# Patient Record
Sex: Female | Born: 2005 | Race: Black or African American | Hispanic: No | Marital: Single | Smoking: Never smoker
Health system: Southern US, Community
[De-identification: ages and names within clinical notes are randomized; demographics above are authoritative.]

## PROBLEM LIST (undated history)

## (undated) DIAGNOSIS — F419 Anxiety disorder, unspecified: Secondary | ICD-10-CM

## (undated) DIAGNOSIS — F32A Depression, unspecified: Secondary | ICD-10-CM

## (undated) DIAGNOSIS — F909 Attention-deficit hyperactivity disorder, unspecified type: Secondary | ICD-10-CM

## (undated) DIAGNOSIS — F329 Major depressive disorder, single episode, unspecified: Secondary | ICD-10-CM

## (undated) DIAGNOSIS — F4310 Post-traumatic stress disorder, unspecified: Secondary | ICD-10-CM

## (undated) HISTORY — DX: Post-traumatic stress disorder, unspecified: F43.10

---
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
## 2023-03-06 ENCOUNTER — Telehealth: Payer: Self-pay

## 2023-03-06 NOTE — Telephone Encounter (Signed)
LVM for patient to call back [phone], or to call PCP office to schedule follow up apt. AS, CMA  

## 2023-03-29 ENCOUNTER — Other Ambulatory Visit: Payer: Self-pay

## 2023-03-29 ENCOUNTER — Emergency Department
Admission: EM | Admit: 2023-03-29 | Discharge: 2023-03-29 | Disposition: A | Discharge status: Home / Self Care | Payer: MEDICAID | Attending: Emergency Medicine | Admitting: Emergency Medicine

## 2023-03-29 DIAGNOSIS — F439 Reaction to severe stress, unspecified: Secondary | ICD-10-CM | POA: Insufficient documentation

## 2023-03-29 DIAGNOSIS — F32A Depression, unspecified: Secondary | ICD-10-CM | POA: Insufficient documentation

## 2023-03-29 LAB — CBC
HCT: 37.4 % (ref 36.0–46.0)
Hemoglobin: 13.4 g/dL (ref 12.0–15.0)
MCH: 32.1 pg (ref 26.0–34.0)
MCHC: 35.8 g/dL (ref 30.0–36.0)
MCV: 89.7 fL (ref 80.0–100.0)
Platelets: 361 10*3/uL (ref 150–400)
RBC: 4.17 MIL/uL (ref 3.87–5.11)
RDW: 12.3 % (ref 11.5–15.5)
WBC: 6 10*3/uL (ref 4.0–10.5)
nRBC: 0 % (ref 0.0–0.2)

## 2023-03-29 LAB — COMPREHENSIVE METABOLIC PANEL
ALT: 12 U/L (ref 0–44)
AST: 15 U/L (ref 15–41)
Albumin: 4.1 g/dL (ref 3.5–5.0)
Alkaline Phosphatase: 76 U/L (ref 38–126)
Anion gap: 9 (ref 5–15)
BUN: 10 mg/dL (ref 6–20)
CO2: 24 mmol/L (ref 22–32)
Calcium: 9.5 mg/dL (ref 8.9–10.3)
Chloride: 105 mmol/L (ref 98–111)
Creatinine, Ser: 0.72 mg/dL (ref 0.44–1.00)
GFR, Estimated: >60 mL/min (ref >60)
Glucose, Bld: 82 mg/dL (ref 70–99)
Potassium: 3.7 mmol/L (ref 3.5–5.1)
Sodium: 138 mmol/L (ref 135–145)
Total Bilirubin: 1.4 mg/dL — ABNORMAL HIGH (ref 0.3–1.2)
Total Protein: 8 g/dL (ref 6.5–8.1)

## 2023-03-29 LAB — URINE DRUG SCREEN, QUALITATIVE (ARMC ONLY)
Amphetamines, Ur Screen: NOT DETECTED
Barbiturates, Ur Screen: NOT DETECTED
Benzodiazepine, Ur Scrn: NOT DETECTED
Cannabinoid 50 Ng, Ur ~~LOC~~: POSITIVE — AB
Cocaine Metabolite,Ur ~~LOC~~: NOT DETECTED
MDMA (Ecstasy)Ur Screen: NOT DETECTED
Methadone Scn, Ur: NOT DETECTED
Opiate, Ur Screen: NOT DETECTED
Phencyclidine (PCP) Ur S: NOT DETECTED
Tricyclic, Ur Screen: NOT DETECTED

## 2023-03-29 LAB — PREGNANCY, URINE: Preg Test, Ur: NEGATIVE

## 2023-03-29 LAB — SALICYLATE LEVEL: Salicylate Lvl: <7 mg/dL — ABNORMAL LOW (ref 7.0–30.0)

## 2023-03-29 LAB — ETHANOL: Alcohol, Ethyl (B): <10 mg/dL (ref <10)

## 2023-03-29 LAB — ACETAMINOPHEN LEVEL: Acetaminophen (Tylenol), Serum: <10 ug/mL — ABNORMAL LOW (ref 10–30)

## 2023-03-29 NOTE — ED Notes (Signed)
VOLUNTARY/AWAITING PSYCH CONSULT

## 2023-03-29 NOTE — ED Provider Notes (Addendum)
[facility] Provider Note    Event Date/Time   First MD Initiated Contact with Patient 03/29/23 1532     (approximate)   History   Chief Complaint Mental Health Problem   HPI  Natalee Lye Marya Amsler Behnke is a 17 y.o. female with past medical history of hyperlipidemia, PTSD, anxiety, depression, and bipolar disorder who presents to the ED for mental health evaluation.  Patient reports that she has been under a lot of stress lately and had a moment earlier today where she walked outside with a bottle of bleach and Tylenol, considered overdosing.  She states that she did not go through with it and states that she does not actually want to harm herself.  She states she has a therapist that she will see regularly, but they have been on vacation this week.  She does not take any medications regularly, does state that she has an appointment scheduled with [facility] tomorrow.  She denies any alcohol or drug use, denies any medical complaints currently.     Physical Exam   Triage Vital Signs: ED Triage Vitals  Encounter Vitals Group     BP 03/29/23 1455 (!) 149/87     Systolic BP Percentile --      Diastolic BP Percentile --      Pulse Rate 03/29/23 1455 67     Resp 03/29/23 1455 18     Temp 03/29/23 1455 98.2 F (36.8 C)     Temp Source 03/29/23 1455 Oral     SpO2 03/29/23 1455 100 %     Weight 03/29/23 1454 230 lb (104.3 kg)     Height 03/29/23 1454 5\' 6"  (1.676 m)     Head Circumference --      Peak Flow --      Pain Score 03/29/23 1454 0     Pain Loc --      Pain Education --      Exclude from Growth Chart --     Most recent vital signs: Vitals:   03/29/23 1455 03/29/23 1850  BP: (!) 149/87 (!) 141/82  Pulse: 67 93  Resp: 18 16  Temp: 98.2 F (36.8 C) 98 F (36.7 C)  SpO2: 100% 98%    Constitutional: Alert and oriented. Eyes: Conjunctivae are normal. Head: Atraumatic. Nose: No congestion/rhinnorhea. Mouth/Throat: Mucous membranes are moist.   Cardiovascular: Normal rate, regular rhythm. Grossly normal heart sounds.  2+ radial pulses bilaterally. Respiratory: Normal respiratory effort.  No retractions. Lungs CTAB. Gastrointestinal: Soft and nontender. No distention. Musculoskeletal: No lower extremity tenderness nor edema.  Neurologic:  Normal speech and language. No gross focal neurologic deficits are appreciated.    ED Results / Procedures / Treatments   Labs (all labs ordered are listed, but only abnormal results are displayed) Labs Reviewed  COMPREHENSIVE METABOLIC PANEL - Abnormal; Notable for the following components:      Result Value   Total Bilirubin 1.4 (*)    All other components within normal limits  SALICYLATE LEVEL - Abnormal; Notable for the following components:   Salicylate Lvl <7.0 (*)    All other components within normal limits  ACETAMINOPHEN LEVEL - Abnormal; Notable for the following components:   Acetaminophen (Tylenol), Serum <10 (*)    All other components within normal limits  URINE DRUG SCREEN, QUALITATIVE ([facility] ONLY) - Abnormal; Notable for the following components:   Cannabinoid 50 Ng, Ur [place] POSITIVE (*)    All other components within normal limits  ETHANOL  CBC  PREGNANCY, URINE    PROCEDURES:  Critical Care performed: No  Procedures   MEDICATIONS ORDERED IN ED: Medications - No data to display   IMPRESSION / MDM / ASSESSMENT AND PLAN / ED COURSE  I reviewed the triage vital signs and the nursing notes.                              17 y.o. female with past medical history of hyperlipidemia, PTSD, anxiety, depression, and bipolar disorder who presents to the ED for psychiatric evaluation after she had thoughts of overdosing earlier today, now denies any ongoing suicidal or homicidal ideation.  Patient's presentation is most consistent with acute presentation with potential threat to life or bodily function.  Differential diagnosis includes, but is not limited to, depression,  anxiety, suicidal ideation, homicidal ideation, substance abuse, anemia, electrolyte abnormality.  Patient nontoxic-appearing and in no acute distress, vital signs are unremarkable.  She denies any medical complaints and screening labs are unremarkable with no significant anemia, leukocytosis, etc. normality, or AKI.  LFTs are unremarkable, Tylenol and salicylate levels are undetectable.  Patient may be medically cleared for psychiatric disposition, she is currently calm and cooperative and we will hold off on IVC.  The patient has been placed in psychiatric observation due to the need to provide a safe environment for the patient while obtaining psychiatric consultation and evaluation, as well as ongoing medical and medication management to treat the patient's condition.  The patient has not been placed under full IVC at this time.  ----------------------------------------- 7:35 PM on 03/29/2023 ----------------------------------------- Patient requesting to leave prior to psychiatric evaluation.  She continues to adamantly deny any suicidal ideation and I do not feel there is any indication for IVC at this time.  While I encouraged her to stay for psychiatric evaluation, patient states that she would like to go and reports she has an appointment at [facility] tomorrow.  I had a discussion with boyfriend who lives with her separately from the patient, who feels comfortable with her going home and does not feel she will attempt to harm herself.  Patient with goal-directed thinking, states she will also follow-up with her therapist.  She was counseled to return to the ED for new or worsening symptoms.  Patient agrees with plan.      FINAL CLINICAL IMPRESSION(S) / ED DIAGNOSES   Final diagnoses:  Depression, unspecified depression type     Rx / DC Orders   ED Discharge Orders     None        Note:  This document was prepared using Dragon voice recognition software and may include unintentional  dictation errors.   Chesley Noon, MD 03/29/23 1643    Chesley Noon, MD 03/29/23 [id number]

## 2023-03-29 NOTE — ED Notes (Signed)
Pt received dinner tray. No complaints at this time.

## 2023-03-29 NOTE — ED Triage Notes (Addendum)
Pt reports being very sad and depressed recently, pt states today she picked up a bottle of bleach and tylenol to drink but never took the tops off. Pt states she has hx of being in mental hospitals. Pt is vol. Calm and cooperative. Pt currently denies SI/HI.

## 2023-03-29 NOTE — ED Notes (Signed)
Patient is sitting in the hallway in triage. Patient denied any discomfort at this time.

## 2023-03-29 NOTE — ED Notes (Signed)
Pt very tearful, crying due to situation. States that she was sitting and thinking at home and had a moment where she got a bottle of bleach and a bottle of tylenol. States she sat there and did not open them or take any. Report no SI, came because she has been here before and wanted to talk to get some aid. Reports she has history of bipolar, anxiety, and depression. Takes no meds as her grandmother had her stop years ago. Pt cooperative and educated on plan of care in ED.  Pt had personal cell phone and this was collected and placed in belonging bag 1 of 1.

## 2023-03-29 NOTE — ED Notes (Signed)
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

## 2023-05-07 ENCOUNTER — Telehealth: Payer: Self-pay

## 2023-05-07 NOTE — Telephone Encounter (Signed)
I contacted the patient via phone. No answer, I left voicemail asking the patient to contact our office.

## 2023-05-07 NOTE — Telephone Encounter (Signed)
Pt calling to schedule an IUD removal; would like to have this done while on her period so it won't hurt as bad.  [phone]

## 2023-05-09 NOTE — Telephone Encounter (Signed)
The patient contacted the office to follow up on a message left with the nurse line. Our call was disconnect. I attempt to contact the patient. No answer.

## 2023-11-28 ENCOUNTER — Ambulatory Visit: Payer: MEDICAID | Admitting: Internal Medicine

## 2023-11-28 ENCOUNTER — Other Ambulatory Visit: Payer: Self-pay

## 2023-11-28 ENCOUNTER — Other Ambulatory Visit (HOSPITAL_COMMUNITY)
Admission: RE | Admit: 2023-11-28 | Discharge: 2023-11-28 | Disposition: A | Discharge status: Home / Self Care | Payer: MEDICAID | Source: Ambulatory Visit | Attending: Internal Medicine | Admitting: Internal Medicine

## 2023-11-28 ENCOUNTER — Encounter: Payer: Self-pay | Admitting: Internal Medicine

## 2023-11-28 VITALS — BP 124/82 | Temp 98.1°F | Resp 18 | Ht 65.5 in | Wt 227.9 lb

## 2023-11-28 DIAGNOSIS — A749 Chlamydial infection, unspecified: Secondary | ICD-10-CM

## 2023-11-28 DIAGNOSIS — E785 Hyperlipidemia, unspecified: Secondary | ICD-10-CM | POA: Diagnosis not present

## 2023-11-28 DIAGNOSIS — Z113 Encounter for screening for infections with a predominantly sexual mode of transmission: Secondary | ICD-10-CM

## 2023-11-28 DIAGNOSIS — R102 Pelvic and perineal pain: Secondary | ICD-10-CM | POA: Diagnosis not present

## 2023-11-28 DIAGNOSIS — L732 Hidradenitis suppurativa: Secondary | ICD-10-CM | POA: Diagnosis not present

## 2023-11-28 DIAGNOSIS — N760 Acute vaginitis: Secondary | ICD-10-CM

## 2023-11-28 DIAGNOSIS — A549 Gonococcal infection, unspecified: Secondary | ICD-10-CM

## 2023-11-28 DIAGNOSIS — N898 Other specified noninflammatory disorders of vagina: Secondary | ICD-10-CM

## 2023-11-28 DIAGNOSIS — B9689 Other specified bacterial agents as the cause of diseases classified elsewhere: Secondary | ICD-10-CM

## 2023-11-28 MED ORDER — CHLORHEXIDINE GLUCONATE 4 % EX SOLN
Freq: Every day | CUTANEOUS | 1 refills | Status: AC | PRN
Start: 2023-11-28 — End: ?

## 2023-11-28 NOTE — Progress Notes (Signed)
   Acute Office Visit  Subjective:     Patient ID: Sonya Grimes, female    DOB: 2006/07/15, 18 y.o.   MRN: [id number]  Chief Complaint  Patient presents with   Vaginal Discharge    Pain during sex   Recurrent Skin Infections    Under stomach     HPI Patient is in today for vaginal discharge and pain with sexual intercourse. Vaginal discharge is yellow with slight odor. Pain is during insertion but also throughout intercourse. Has an IUD but now periods are better but has an appointment with Gynecology to take out IUD next week. She is currently sexually active with 2 female partners, on and off. No new partners. Usually uses condoms but has not been doing so recently.    Also has bumps on her stomach that are sometimes painful. No drainage, no fevers.   Review of Systems  Constitutional:  Negative for chills and fever.  Genitourinary:  Negative for dysuria, frequency and urgency.  Skin:  Positive for rash.        Objective:    BP 124/82 (Cuff Size: Large)   Temp 98.1 F (36.7 C) (Oral)   Resp 18   Ht 5' 5.5" (1.664 m)   Wt 227 lb 14.4 oz (103.4 kg)   BMI 37.35 kg/m  BP Readings from Last 3 Encounters:  11/28/23 124/82  03/29/23 (!) 141/82  10/15/22 (!) 128/90   Wt Readings from Last 3 Encounters:  11/28/23 227 lb 14.4 oz (103.4 kg)  03/29/23 230 lb (104.3 kg) (99%, Z= 2.32)*  10/15/22 226 lb 12.8 oz (102.9 kg) (99%, Z= 2.27)*   * Growth percentiles are based on CDC (Girls, 2-20 Years) data.      Physical Exam Constitutional:      Appearance: Normal appearance.  HENT:     Head: Normocephalic and atraumatic.  Eyes:     Conjunctiva/sclera: Conjunctivae normal.  Cardiovascular:     Rate and Rhythm: Normal rate and regular rhythm.  Pulmonary:     Effort: Pulmonary effort is normal.     Breath sounds: Normal breath sounds.  Skin:    General: Skin is warm and dry.     Comments: Skin change on stomach consistent with hidradenitis suppurativa   Neurological:     General: No focal deficit present.     Mental Status: She is alert. Mental status is at baseline.  Psychiatric:        Mood and Affect: Mood normal.        Behavior: Behavior normal.     No results found for any visits on 11/28/23.      Assessment & Plan:   1. Vaginal discharge (Primary)/Screening examination for STD (sexually transmitted disease)/Pelvic pain: Vaginal swab today, STD screening ordered. Planning to have IUD removed, briefly discussed other contraception options. Labs due.   - Cervicovaginal ancillary only - HIV antibody (with reflex) - RPR - CBC w/Diff/Platelet - COMPLETE METABOLIC PANEL WITH GFR  2. Hidradenitis suppurativa: Not inflamed currently, no infection. Prescribe Hibiclens soap to use.   - chlorhexidine (HIBICLENS) 4 % external liquid; Apply topically daily as needed.  Dispense: 946 mL; Refill: 1  3. Dyslipidemia: Working on losing weight, recheck lipids.   - Lipid Profile   Return in about 1 year (around 11/27/2024), or if symptoms worsen or fail to improve.  Margarita Mail, DO

## 2023-11-29 LAB — CBC WITH DIFFERENTIAL/PLATELET
Absolute Lymphocytes: 2275 {cells}/uL (ref 850–3900)
Absolute Monocytes: 469 {cells}/uL (ref 200–950)
Basophils Absolute: 31 {cells}/uL (ref 0–200)
Basophils Relative: 0.6 %
Eosinophils Absolute: 128 {cells}/uL (ref 15–500)
Eosinophils Relative: 2.5 %
HCT: 37.4 % (ref 35.0–45.0)
Hemoglobin: 12.6 g/dL (ref 11.7–15.5)
MCH: 32 pg (ref 27.0–33.0)
MCHC: 33.7 g/dL (ref 32.0–36.0)
MCV: 94.9 fL (ref 80.0–100.0)
MPV: 10.2 fL (ref 7.5–12.5)
Monocytes Relative: 9.2 %
Neutro Abs: 2198 {cells}/uL (ref 1500–7800)
Neutrophils Relative %: 43.1 %
Platelets: 384 10*3/uL (ref 140–400)
RBC: 3.94 10*6/uL (ref 3.80–5.10)
RDW: 12.4 % (ref 11.0–15.0)
Total Lymphocyte: 44.6 %
WBC: 5.1 10*3/uL (ref 3.8–10.8)

## 2023-11-29 LAB — COMPLETE METABOLIC PANEL WITH GFR
AG Ratio: 1.2 (calc) (ref 1.0–2.5)
ALT: 9 U/L (ref 6–29)
AST: 14 U/L (ref 10–30)
Albumin: 4 g/dL (ref 3.6–5.1)
Alkaline phosphatase (APISO): 79 U/L (ref 31–125)
BUN: 13 mg/dL (ref 7–25)
CO2: 30 mmol/L (ref 20–32)
Calcium: 9.6 mg/dL (ref 8.6–10.2)
Chloride: 105 mmol/L (ref 98–110)
Creat: 0.83 mg/dL (ref 0.50–0.96)
Globulin: 3.3 g/dL (ref 1.9–3.7)
Glucose, Bld: 83 mg/dL (ref 65–99)
Potassium: 4.3 mmol/L (ref 3.5–5.3)
Sodium: 140 mmol/L (ref 135–146)
Total Bilirubin: 0.5 mg/dL (ref 0.2–1.2)
Total Protein: 7.3 g/dL (ref 6.1–8.1)

## 2023-11-29 LAB — LIPID PANEL
Cholesterol: 254 mg/dL — ABNORMAL HIGH (ref <200)
HDL: 69 mg/dL (ref >=50)
LDL Cholesterol (Calc): 169 mg/dL — ABNORMAL HIGH
Non-HDL Cholesterol (Calc): 185 mg/dL — ABNORMAL HIGH (ref <130)
Total CHOL/HDL Ratio: 3.7 (calc) (ref <5.0)
Triglycerides: 64 mg/dL (ref <150)

## 2023-11-29 LAB — HIV ANTIBODY (ROUTINE TESTING W REFLEX): HIV 1&2 Ab, 4th Generation: NONREACTIVE

## 2023-11-29 LAB — RPR: RPR Ser Ql: NONREACTIVE

## 2023-12-02 ENCOUNTER — Ambulatory Visit: Payer: MEDICAID | Admitting: Certified Nurse Midwife

## 2023-12-02 ENCOUNTER — Telehealth: Payer: Self-pay

## 2023-12-02 ENCOUNTER — Ambulatory Visit: Payer: MEDICAID

## 2023-12-02 DIAGNOSIS — N898 Other specified noninflammatory disorders of vagina: Secondary | ICD-10-CM

## 2023-12-02 DIAGNOSIS — A64 Unspecified sexually transmitted disease: Secondary | ICD-10-CM

## 2023-12-02 DIAGNOSIS — Z30432 Encounter for removal of intrauterine contraceptive device: Secondary | ICD-10-CM

## 2023-12-02 LAB — CERVICOVAGINAL ANCILLARY ONLY
Bacterial Vaginitis (gardnerella): POSITIVE — AB
Candida Glabrata: NEGATIVE
Candida Vaginitis: NEGATIVE
Chlamydia: POSITIVE — AB
Comment: NEGATIVE
Comment: NEGATIVE
Comment: NEGATIVE
Comment: NEGATIVE
Comment: NEGATIVE
Comment: NORMAL
Neisseria Gonorrhea: POSITIVE — AB
Trichomonas: NEGATIVE

## 2023-12-02 MED ORDER — DOXYCYCLINE HYCLATE 100 MG PO TABS
100.0000 mg | ORAL_TABLET | Freq: Two times a day (BID) | ORAL | 0 refills | Status: AC
Start: 2023-12-02 — End: 2023-12-09

## 2023-12-02 MED ORDER — CEFTRIAXONE SODIUM 500 MG IJ SOLR
1000.0000 mg | Freq: Once | INTRAMUSCULAR | Status: AC
Start: 2023-12-02 — End: 2023-12-02
  Administered 2023-12-02: 1000 mg via INTRAMUSCULAR

## 2023-12-02 MED ORDER — METRONIDAZOLE 500 MG PO TABS
500.0000 mg | ORAL_TABLET | Freq: Three times a day (TID) | ORAL | 0 refills | Status: AC
Start: 2023-12-02 — End: 2023-12-09

## 2023-12-02 NOTE — Telephone Encounter (Signed)
 Per Oley Balm, CNM Missy's last note: IUD removal with MD. Rx for pre-procedure Ativan given. Rx expired. Pt is scheduled with you on 12/18/23. You approve Rx to be sent?

## 2023-12-02 NOTE — Progress Notes (Unsigned)
 Patient is in office today for a nurse visit for  STD . Patient Injection was given in the  Right vastus lateralis. Patient tolerated injection well.

## 2023-12-02 NOTE — Telephone Encounter (Signed)
 Pt has an appt this afternoon with you for IUD removal. Would like to know if a RF of Rx to calm her nerves could be called in so she can take before IUD removal. Does not remember name of med. Advised msg would be sent to provider.

## 2023-12-02 NOTE — Addendum Note (Signed)
 Addended by: Margarita Mail on: 12/02/2023 03:36 PM   Modules accepted: Orders

## 2023-12-03 NOTE — Telephone Encounter (Signed)
 Called pt, no answer, unable to leave voice msg.

## 2023-12-03 NOTE — Telephone Encounter (Signed)
 I would encourage patient to send refill request as prescription expired and let Missy know.

## 2023-12-16 NOTE — Progress Notes (Unsigned)
    GYNECOLOGY OFFICE PROCEDURE NOTE  Sonya Grimes is a 18 y.o. G0P0000 here for Mirena IUD removal. No GYN concerns. She is not age appropriate for a pap smear.  IUD Removal with Endosee Device Patient identified, informed consent performed, consent signed.  Patient was in the dorsal lithotomy position, normal external genitalia was noted.  A speculum was placed in the patient's vagina, normal discharge was noted, no lesions. The cervix was visualized, no lesions, no abnormal discharge. Nulliparous cervix noted.  The strings of the IUD were not visualized, so the cervix was dilated with plastic dilators and an IUD hook was used to attempt to retrieve the strings, however unsuccessful. Tonsil forceps were then introduced into the endometrial cavity and the IUD still was unable to be retrieved.  Discussed option of patient returning for IUD removal under hysteroscopic guidance either in office or OR, or proceeding with procedure in office today. Patient ok to perform today.   The patient was then moved to procedure room,  She was then prepped and draped in the normal sterile fashion, and placed in the dorsal lithotomy position. Previously had pre-procedure Ativan prior to arrival.  A time out was performed.  A sterile speculum was inserted into vagina. The cervix was injected circumferentially with 10 cc of 1% lidocaine. A single-tooth tenaculum was used to grasp the anterior lip of the cervix. Cervical dilation was performed. A 5 mm Endosee hysteroscope was introduced into the uterus under direct visualization. The cavity was allowed to fill, and then the entire cavity was explored with the IUD located fundally, as well as the IUD threads. The hysteroscope was removed. Several more attempts were then made using the IUD hook, tonsils, and the hysteroscopic forceps until the IUD threads were grasped and the IUD was removed in its entirety.The patient tolerated the procedure well.  Procedure  performed in the presence of a chaperone.   Patient will use no method for contraception (currently in same-sex relationship).  Routine preventative health maintenance measures emphasized.       Total Fluids:  75 ml of normal saline infused into uterine cavity   Hildred Laser, MD [facility]

## 2023-12-18 ENCOUNTER — Other Ambulatory Visit: Payer: Self-pay | Admitting: Obstetrics

## 2023-12-18 ENCOUNTER — Ambulatory Visit (INDEPENDENT_AMBULATORY_CARE_PROVIDER_SITE_OTHER): Payer: MEDICAID | Admitting: Obstetrics and Gynecology

## 2023-12-18 ENCOUNTER — Encounter: Payer: Self-pay | Admitting: Obstetrics and Gynecology

## 2023-12-18 VITALS — BP 116/79 | HR 81 | Resp 16 | Ht 65.0 in | Wt 225.4 lb

## 2023-12-18 DIAGNOSIS — Z30432 Encounter for removal of intrauterine contraceptive device: Secondary | ICD-10-CM

## 2023-12-18 DIAGNOSIS — T8332XD Displacement of intrauterine contraceptive device, subsequent encounter: Secondary | ICD-10-CM

## 2023-12-18 DIAGNOSIS — R102 Pelvic and perineal pain: Secondary | ICD-10-CM

## 2023-12-18 MED ORDER — LORAZEPAM 1 MG PO TABS: 1.0000 mg | ORAL_TABLET | ORAL | 0 refills | Status: AC | PRN

## 2023-12-18 NOTE — Telephone Encounter (Signed)
 Called pt twice, no answer. Unable to leave voice msg due to mailbox not set up.

## 2023-12-18 NOTE — Progress Notes (Signed)
 Rx sent for lorazepam 1-2 mg once for pre-procedure anxiety  Glenetta Borg, CNM

## 2023-12-18 NOTE — Telephone Encounter (Signed)
 Pt calling triage, requesting RF on LORazepam (ATIVAN) 1 MG tablet for IUD removal. Rx RF sent by Missy. I have tried multiple times reaching out to pt, call cannot go through. Voice mail not set up. Sending mychart msg.

## 2024-02-05 ENCOUNTER — Telehealth: Payer: Self-pay

## 2024-02-05 NOTE — Telephone Encounter (Signed)
 Sonya Grimes called triage line after hours stating she passed some blood clots this morning and some brown discharge. She states her app shows she should start today but she normally has a lot of pain and vomiting.  I left a voicemail to return my call.

## 2024-05-05 ENCOUNTER — Ambulatory Visit: Payer: MEDICAID | Admitting: Family Medicine

## 2024-11-30 ENCOUNTER — Ambulatory Visit: Payer: MEDICAID | Admitting: Family Medicine
